# Patient Record
Sex: Female | Born: 2005 | Hispanic: Yes | Marital: Single | Smoking: Never smoker
Health system: Southern US, Community
[De-identification: ages and names within clinical notes are randomized; demographics above are authoritative.]

---
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
## 2023-08-13 ENCOUNTER — Ambulatory Visit
Admission: EM | Admit: 2023-08-13 | Discharge: 2023-08-13 | Disposition: A | Discharge status: Home / Self Care | Attending: Physician Assistant | Admitting: Physician Assistant

## 2023-08-13 DIAGNOSIS — N94819 Vulvodynia, unspecified: Secondary | ICD-10-CM | POA: Diagnosis not present

## 2023-08-13 DIAGNOSIS — N760 Acute vaginitis: Secondary | ICD-10-CM

## 2023-08-13 MED ORDER — DOXYCYCLINE HYCLATE 100 MG PO CAPS: 100.0000 mg | ORAL_CAPSULE | Freq: Two times a day (BID) | ORAL | 0 refills | Status: DC

## 2023-08-13 MED ORDER — CLOTRIMAZOLE-BETAMETHASONE 1-0.05 % EX CREA: TOPICAL_CREAM | CUTANEOUS | 0 refills | Status: DC

## 2023-08-13 NOTE — Discharge Instructions (Addendum)
Soak area 20 minutes 4 times a day.  Return if any problems.  

## 2023-08-13 NOTE — ED Triage Notes (Signed)
 Patient to Urgent Care with complaints of a cyst/ irritation to the outside of her clitoris.  Reports the area is very painful and makes it difficult to walk.   Symptoms x two days. Hx of the same. Denies any concerns for STDs. Denies any vaginal discharge.

## 2023-08-13 NOTE — ED Provider Notes (Signed)
 Arlander Bellman    CSN: [id number] Arrival date & time: 08/13/23  1904      History   Chief Complaint Chief Complaint  Patient presents with   Abscess    HPI ALICIANNA CONNELLEY is a 18 y.o. female.   Pt complains of a swollen area to the top of clitoris.  Pt treated for the same 1 year ago.    The history is provided by the patient. No language interpreter was used.  Abscess Location:  Pelvis Pelvic abscess location:  Vagina Size:  0.7 Abscess quality: not draining   Progression:  Worsening Chronicity:  New Relieved by:  Nothing Worsened by:  Nothing Ineffective treatments:  None tried Associated symptoms: no fever     History reviewed. No pertinent past medical history.  There are no active problems to display for this patient.   History reviewed. No pertinent surgical history.  OB History   No obstetric history on file.      Home Medications    Prior to Admission medications   Medication Sig Start Date End Date Taking? Authorizing Provider  doxycycline (VIBRAMYCIN) 100 MG capsule Take 1 capsule (100 mg total) by mouth 2 (two) times daily. 08/13/23  Yes Takeela Peil K, PA-C  clotrimazole -betamethasone  (LOTRISONE ) cream Apply a small amount to affected area twice daily. 08/13/23   Sandi Crosby, PA-C    Family History History reviewed. No pertinent family history.  Social History Social History   Tobacco Use   Smoking status: Never   Smokeless tobacco: Never  Vaping Use   Vaping status: Never Used  Substance Use Topics   Alcohol use: Never   Drug use: Never     Allergies   Patient has no known allergies.   Review of Systems Review of Systems  Constitutional:  Negative for fever.  All other systems reviewed and are negative.    Physical Exam Triage Vital Signs ED Triage Vitals  Encounter Vitals Group     BP 08/13/23 1916 131/85     Systolic BP Percentile --      Diastolic BP Percentile --      Pulse Rate 08/13/23 1916 93      Resp 08/13/23 1916 18     Temp 08/13/23 1916 98 F (36.7 C)     Temp src --      SpO2 08/13/23 1916 98 %     Weight --      Height --      Head Circumference --      Peak Flow --      Pain Score 08/13/23 1910 7     Pain Loc --      Pain Education --      Exclude from Growth Chart --    No data found.  Updated Vital Signs BP 131/85   Pulse 93   Temp 98 F (36.7 C)   Resp 18   LMP 08/06/2023   SpO2 98%   Visual Acuity Right Eye Distance:   Left Eye Distance:   Bilateral Distance:    Right Eye Near:   Left Eye Near:    Bilateral Near:     Physical Exam Vitals reviewed.  Constitutional:      Appearance: Normal appearance.  Genitourinary:    Comments: 7mm swollen area above clitoris, Skin:    General: Skin is warm.  Neurological:     General: No focal deficit present.     Mental Status: She is alert.  Psychiatric:  Mood and Affect: Mood normal.      UC Treatments / Results  Labs (all labs ordered are listed, but only abnormal results are displayed) Labs Reviewed - No data to display  EKG   Radiology No results found.  Procedures Procedures (including critical care time)  Medications Ordered in UC Medications - No data to display  Initial Impression / Assessment and Plan / UC Course  I have reviewed the triage vital signs and the nursing notes.  Pertinent labs & imaging results that were available during my care of the patient were reviewed by me and considered in my medical decision making (see chart for details).     Pt offered option of needle aspiration.  Pt declines. Rx for doxycycline and Lotrisone .  (Pt used for same previously)  Final Clinical Impressions(s) / UC Diagnoses   Final diagnoses:  Abscess, vagina     Discharge Instructions      Soak area 20 minutes 4 times a day.  Return if any problems.     ED Prescriptions     Medication Sig Dispense Auth. Provider   clotrimazole -betamethasone  (LOTRISONE ) cream Apply a  small amount to affected area twice daily. 15 g Starlena Beil K, PA-C   doxycycline (VIBRAMYCIN) 100 MG capsule Take 1 capsule (100 mg total) by mouth 2 (two) times daily. 20 capsule Ileanna Gemmill K, PA-C      An After Visit Summary was printed and given to the patient.     PDMP not reviewed this encounter.   Sandi Crosby, PA-C 08/13/23 [phone]

## 2024-05-16 ENCOUNTER — Other Ambulatory Visit: Payer: Self-pay

## 2024-05-16 ENCOUNTER — Ambulatory Visit (INDEPENDENT_AMBULATORY_CARE_PROVIDER_SITE_OTHER): Admitting: Internal Medicine

## 2024-05-16 VITALS — BP 112/78 | HR 90 | Temp 98.3°F | Resp 18 | Ht 62.0 in | Wt 213.8 lb

## 2024-05-16 DIAGNOSIS — F419 Anxiety disorder, unspecified: Secondary | ICD-10-CM

## 2024-05-16 DIAGNOSIS — N912 Amenorrhea, unspecified: Secondary | ICD-10-CM

## 2024-05-16 DIAGNOSIS — E669 Obesity, unspecified: Secondary | ICD-10-CM | POA: Diagnosis not present

## 2024-05-16 DIAGNOSIS — R6889 Other general symptoms and signs: Secondary | ICD-10-CM

## 2024-05-16 DIAGNOSIS — R41840 Attention and concentration deficit: Secondary | ICD-10-CM | POA: Diagnosis not present

## 2024-05-16 DIAGNOSIS — Z114 Encounter for screening for human immunodeficiency virus [HIV]: Secondary | ICD-10-CM

## 2024-05-16 DIAGNOSIS — Z1159 Encounter for screening for other viral diseases: Secondary | ICD-10-CM | POA: Diagnosis not present

## 2024-05-16 DIAGNOSIS — Z833 Family history of diabetes mellitus: Secondary | ICD-10-CM

## 2024-05-16 NOTE — Patient Instructions (Signed)
 Birth Control: The Options Birth control is also called contraception. Birth control prevents pregnancy. There are many types of birth control. Work with your health care provider to find the best option for you. Birth control that uses hormones These types of birth control have hormones in them to prevent pregnancy. Birth control implant This is a small tube that is put into the skin of your arm. The tube can stay in for up to 3 years. Birth control shot These are shots you get every 3 months. Birth control pills This is a pill you take every day. You need to take it at the same time each day. Birth control patch This is a patch that you put on your skin. You change it 1 time each week for 3 weeks. After that, you take the patch off for 1 week. Vaginal ring  This is a soft plastic ring that you put in your vagina. The ring is left in for 3 weeks. Then, you take it out for 1 week. Then, you put a new ring in. Barrier methods  Female condom This is a thin covering that you put on the penis before sex. The condom is thrown away after sex. Female condom This is a soft, loose covering that you put in the vagina before sex. The condom is thrown away after sex. Diaphragm A diaphragm is a soft barrier that is shaped like a bowl. It must be made to fit your body. You put it in the vagina before sex with a chemical that kills sperm called spermicide. A diaphragm should be left in the vagina for 6-8 hours after sex and taken out within 24 hours. You need to replace a diaphragm: Every 1-2 years. After giving birth. After gaining more than 15 lb (6.8 kg). If you have surgery on your pelvis. Cervical cap This is a small, soft cup that fits over the cervix. The cervix is the lowest part of the uterus. It's put in the vagina before sex, along with spermicide. The cap must be made for you. The cap should be left in for 6-8 hours after sex. It is taken out within 48 hours. A cervical cap must be  prescribed and fit to your body by a provider. It should be replaced every 2 years. Sponge This is a small sponge that is put into the vagina before sex. It must be left in for at least 6 hours after sex. It must be taken out within 30 hours and thrown away. Spermicides These are chemicals that kill or stop sperm from getting into the uterus. They may be a pill, cream, jelly, or foam that you put into your vagina. They should be used at least 10-15 minutes before sex. Intrauterine device An intrauterine device (IUD) is a device that's put in the uterus by a provider. There are two types: Hormone IUD. This kind can stay in for 3-5 years. Copper IUD. This kind can stay in for 10 years. Permanent birth control Female tubal ligation This is surgery to block the fallopian tubes. Female sterilization This is a surgery, called a vasectomy, to tie off the tubes that carry sperm in men. This method takes 3 months to work. Other forms of birth control must be used for 3 months. Natural planning methods This means not having sex on the days the female partner could get pregnant. Here are some types of natural planning birth control: Using a calendar: To keep track of the length of each menstrual cycle. To  find out what days pregnancy can happen. To plan to not have sex on days when pregnancy can happen. Watching for signs of ovulation and not having sex during this time. The female partner can check for ovulation by keeping track of their temperature each day. They can also look for changes in the mucus that comes from the cervix. Where to find more information Centers for Disease Control and Prevention: tonerpromos.no. Then: Enter birth control in the search box. This information is not intended to replace advice given to you by your health care provider. Make sure you discuss any questions you have with your health care provider. Document Revised: 02/03/2024 Document Reviewed: 05/26/2022 Elsevier Patient  Education  2025 [employer].

## 2024-05-17 LAB — COMPREHENSIVE METABOLIC PANEL WITH GFR
AG Ratio: 1.9 (calc) (ref 1.0–2.5)
ALT: 19 U/L (ref 6–29)
AST: 15 U/L (ref 10–30)
Albumin: 5 g/dL (ref 3.6–5.1)
Alkaline phosphatase (APISO): 175 U/L — ABNORMAL HIGH (ref 31–125)
BUN: 14 mg/dL (ref 7–25)
CO2: 25 mmol/L (ref 20–32)
Calcium: 9.6 mg/dL (ref 8.6–10.2)
Chloride: 104 mmol/L (ref 98–110)
Creat: 0.76 mg/dL (ref 0.50–0.96)
Globulin: 2.7 g/dL (ref 1.9–3.7)
Glucose, Bld: 87 mg/dL (ref 65–99)
Potassium: 4.4 mmol/L (ref 3.5–5.3)
Sodium: 137 mmol/L (ref 135–146)
Total Bilirubin: 0.4 mg/dL (ref 0.2–1.2)
Total Protein: 7.7 g/dL (ref 6.1–8.1)
eGFR: 115 mL/min/{1.73_m2}

## 2024-05-17 LAB — CBC WITH DIFFERENTIAL/PLATELET
Absolute Lymphocytes: 2296 {cells}/uL (ref 850–3900)
Absolute Monocytes: 459 {cells}/uL (ref 200–950)
Basophils Absolute: 22 {cells}/uL (ref 0–200)
Basophils Relative: 0.4 %
Eosinophils Absolute: 157 {cells}/uL (ref 15–500)
Eosinophils Relative: 2.8 %
HCT: 45.2 % (ref 35.9–46.0)
Hemoglobin: 15.6 g/dL — ABNORMAL HIGH (ref 11.7–15.5)
MCH: 32 pg (ref 27.0–33.0)
MCHC: 34.5 g/dL (ref 31.6–35.4)
MCV: 92.8 fL (ref 81.4–101.7)
MPV: 9.9 fL (ref 7.5–12.5)
Monocytes Relative: 8.2 %
Neutro Abs: 2666 {cells}/uL (ref 1500–7800)
Neutrophils Relative %: 47.6 %
Platelets: 382 10*3/uL (ref 140–400)
RBC: 4.87 Million/uL (ref 3.80–5.10)
RDW: 11.6 % (ref 11.0–15.0)
Total Lymphocyte: 41 %
WBC: 5.6 10*3/uL (ref 3.8–10.8)

## 2024-05-17 LAB — HIV ANTIBODY (ROUTINE TESTING W REFLEX)
HIV 1&2 Ab, 4th Generation: NONREACTIVE
HIV FINAL INTERPRETATION: NEGATIVE

## 2024-05-17 LAB — HEPATITIS C ANTIBODY: Hepatitis C Ab: NONREACTIVE

## 2024-05-17 LAB — HEMOGLOBIN A1C
Hgb A1c MFr Bld: 5.2 %
Mean Plasma Glucose: 103 mg/dL
eAG (mmol/L): 5.7 mmol/L

## 2024-05-17 LAB — TSH: TSH: 1.46 m[IU]/L

## 2024-05-17 LAB — FSH/LH
FSH: 5.8 m[IU]/mL
LH: 5.9 m[IU]/mL

## 2024-05-17 LAB — PROLACTIN: Prolactin: 10.1 ng/mL

## 2024-05-17 LAB — HCG, QUANTITATIVE, PREGNANCY: HCG, Total, QN: <5 m[IU]/mL

## 2024-05-19 ENCOUNTER — Ambulatory Visit: Payer: Self-pay | Admitting: Internal Medicine

## 2024-06-13 ENCOUNTER — Encounter: Admitting: Dietician
